# Patient Record
Sex: Male | Born: 2000 | Race: Black or African American | Hispanic: No | Marital: Single | Smoking: Never smoker
Health system: Southern US, Community
[De-identification: ages and names within clinical notes are randomized; demographics above are authoritative.]

## PROBLEM LIST (undated history)

## (undated) DIAGNOSIS — K4090 Unilateral inguinal hernia, without obstruction or gangrene, not specified as recurrent: Secondary | ICD-10-CM

## (undated) DIAGNOSIS — J45909 Unspecified asthma, uncomplicated: Secondary | ICD-10-CM

## (undated) HISTORY — PX: HERNIA REPAIR: SHX51

---
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
## 2021-04-21 ENCOUNTER — Ambulatory Visit (HOSPITAL_COMMUNITY)
Admission: EM | Admit: 2021-04-21 | Discharge: 2021-04-21 | Disposition: A | Discharge status: Home / Self Care | Payer: Medicaid Other | Attending: Physician Assistant | Admitting: Physician Assistant

## 2021-04-21 ENCOUNTER — Encounter (HOSPITAL_COMMUNITY): Payer: Self-pay

## 2021-04-21 ENCOUNTER — Other Ambulatory Visit: Payer: Self-pay

## 2021-04-21 DIAGNOSIS — R519 Headache, unspecified: Secondary | ICD-10-CM | POA: Insufficient documentation

## 2021-04-21 DIAGNOSIS — R0789 Other chest pain: Secondary | ICD-10-CM | POA: Diagnosis not present

## 2021-04-21 DIAGNOSIS — Z79899 Other long term (current) drug therapy: Secondary | ICD-10-CM | POA: Diagnosis not present

## 2021-04-21 DIAGNOSIS — Z20822 Contact with and (suspected) exposure to covid-19: Secondary | ICD-10-CM | POA: Insufficient documentation

## 2021-04-21 DIAGNOSIS — R112 Nausea with vomiting, unspecified: Secondary | ICD-10-CM | POA: Insufficient documentation

## 2021-04-21 DIAGNOSIS — J029 Acute pharyngitis, unspecified: Secondary | ICD-10-CM | POA: Insufficient documentation

## 2021-04-21 DIAGNOSIS — R0602 Shortness of breath: Secondary | ICD-10-CM | POA: Diagnosis not present

## 2021-04-21 DIAGNOSIS — J069 Acute upper respiratory infection, unspecified: Secondary | ICD-10-CM | POA: Diagnosis not present

## 2021-04-21 LAB — POC INFLUENZA A AND B ANTIGEN (URGENT CARE ONLY)
INFLUENZA A ANTIGEN, POC: NEGATIVE
INFLUENZA B ANTIGEN, POC: NEGATIVE

## 2021-04-21 MED ORDER — METHYLPREDNISOLONE SODIUM SUCC 125 MG IJ SOLR
60.0000 mg | Freq: Once | INTRAMUSCULAR | Status: AC
  Administered 2021-04-21: 60 mg via INTRAMUSCULAR

## 2021-04-21 MED ORDER — ALBUTEROL SULFATE HFA 108 (90 BASE) MCG/ACT IN AERS
INHALATION_SPRAY | RESPIRATORY_TRACT | Status: AC
Start: 2021-04-21 — End: ?
  Filled 2021-04-21: qty 6.7

## 2021-04-21 MED ORDER — ALBUTEROL SULFATE HFA 108 (90 BASE) MCG/ACT IN AERS
2.0000 | INHALATION_SPRAY | Freq: Once | RESPIRATORY_TRACT | Status: AC
  Administered 2021-04-21: 2 via RESPIRATORY_TRACT

## 2021-04-21 MED ORDER — PROMETHAZINE-DM 6.25-15 MG/5ML PO SYRP: 5.0000 mL | ORAL_SOLUTION | Freq: Two times a day (BID) | ORAL | 0 refills | Status: DC | PRN

## 2021-04-21 MED ORDER — METHYLPREDNISOLONE SODIUM SUCC 125 MG IJ SOLR
INTRAMUSCULAR | Status: AC
  Filled 2021-04-21: qty 2

## 2021-04-21 NOTE — ED Provider Notes (Signed)
[facility]    CSN: [id number] Arrival date & time: 04/21/21  1839      History   Chief Complaint Chief Complaint  Patient presents with   Cough    HPI Carlos Stevens is a 20 y.o. male.   Patient presents today with a 4-day history of URI symptoms.  Reports sore throat, cough, headache, chest tightness, nausea, vomiting, runny nose.  He denies any fever, chest pain, abdominal pain.  Reports household sick contacts with similar symptoms but they have not been evaluated for illness.  He does have a history of asthma and reports increased shortness of breath since symptoms began.  He does not have an albuterol at home for symptoms.  He has tried multisymptom medication (DayQuil/NyQuil) with minimal improvement of symptoms.  He has not had COVID but has had COVID-19 vaccinations.  He denies any history of diabetes and does well with steroids.  Denies any recent antibiotic use.  He has had difficulty with daily activities as result of symptoms.   Past Medical History:  Diagnosis Date   Asthma    Inguinal hernia     There are no problems to display for this patient.   Past Surgical History:  Procedure Laterality Date   HERNIA REPAIR         Home Medications    Prior to Admission medications   Medication Sig Start Date End Date Taking? Authorizing Provider  promethazine-dextromethorphan (PROMETHAZINE-DM) 6.25-15 MG/5ML syrup Take 5 mLs by mouth 2 (two) times daily as needed for cough. 04/21/21  Yes ,  K, PA-C  albuterol (PROVENTIL HFA;VENTOLIN HFA) 108 (90 Base) MCG/ACT inhaler Inhale 4 puffs into the lungs every 4 (four) hours as needed for wheezing or shortness of breath. 08/22/15   Viviano Simas, NP  albuterol (PROVENTIL) (2.5 MG/3ML) 0.083% nebulizer solution Inhale 3 mLs into the lungs every 4 (four) hours as needed for wheezing or shortness of breath. 08/22/15   Viviano Simas, NP  ibuprofen (ADVIL,MOTRIN) 200 MG tablet Take 400 mg by mouth every 6  (six) hours as needed.    [provider]  penicillin v potassium (VEETID) 500 MG tablet Take 1 tablet (500 mg total) by mouth 4 (four) times daily. 12/29/14   Emilia Beck, PA-C    Family History History reviewed. No pertinent family history.  Social History Social History   Tobacco Use   Smoking status: Never   Smokeless tobacco: Never     Allergies   Patient has no known allergies.   Review of Systems Review of Systems  Constitutional:  Positive for activity change and fatigue. Negative for appetite change and fever.  HENT:  Positive for congestion and sore throat. Negative for sinus pressure and sneezing.   Respiratory:  Positive for cough, chest tightness and shortness of breath.   Cardiovascular:  Negative for chest pain.  Gastrointestinal:  Positive for nausea and vomiting. Negative for abdominal pain and diarrhea.  Musculoskeletal:  Positive for arthralgias and myalgias.  Neurological:  Positive for headaches. Negative for dizziness and light-headedness.    Physical Exam Triage Vital Signs ED Triage Vitals  Enc Vitals Group     BP 04/21/21 1950 (!) 130/104     Pulse Rate 04/21/21 1950 66     Resp 04/21/21 1950 19     Temp 04/21/21 1950 98.6 F (37 C)     Temp Source 04/21/21 1950 Oral     SpO2 04/21/21 1950 98 %     Weight --  Height --      Head Circumference --      Peak Flow --      Pain Score 04/21/21 1949 1     Pain Loc --      Pain Edu? --      Excl. in [place]? --    No data found.  Updated Vital Signs BP (!) 130/104 (BP Location: Right Arm)   Pulse 66   Temp 98.6 F (37 C) (Oral)   Resp 19   SpO2 98%   Visual Acuity Right Eye Distance:   Left Eye Distance:   Bilateral Distance:    Right Eye Near:   Left Eye Near:    Bilateral Near:     Physical Exam Vitals reviewed.  Constitutional:      General: He is awake.     Appearance: Normal appearance. He is normal weight. He is not ill-appearing.  HENT:     Head:  Normocephalic and atraumatic.     Right Ear: Tympanic membrane, ear canal and external ear normal. Tympanic membrane is not erythematous or bulging.     Left Ear: Tympanic membrane, ear canal and external ear normal. Tympanic membrane is not erythematous or bulging.     Nose: Nose normal.     Mouth/Throat:     Pharynx: Uvula midline. Posterior oropharyngeal erythema present. No oropharyngeal exudate.  Cardiovascular:     Rate and Rhythm: Normal rate and regular rhythm.     Heart sounds: Normal heart sounds, S1 normal and S2 normal. No murmur heard. Pulmonary:     Effort: Pulmonary effort is normal. No accessory muscle usage or respiratory distress.     Breath sounds: Normal breath sounds. No stridor. No wheezing, rhonchi or rales.     Comments: Decreased aeration bilateral bases that improved with in office albuterol Abdominal:     General: Bowel sounds are normal.     Palpations: Abdomen is soft.     Tenderness: There is no abdominal tenderness.  Lymphadenopathy:     Head:     Right side of head: No submental, submandibular or tonsillar adenopathy.     Left side of head: No submental, submandibular or tonsillar adenopathy.     Cervical: No cervical adenopathy.  Neurological:     Mental Status: He is alert.  Psychiatric:        Behavior: Behavior is cooperative.     UC Treatments / Results  Labs (all labs ordered are listed, but only abnormal results are displayed) Labs Reviewed  SARS CORONAVIRUS 2 (TAT 6-24 HRS)  POC INFLUENZA A AND B ANTIGEN (URGENT CARE ONLY)    EKG   Radiology No results found.  Procedures Procedures (including critical care time)  Medications Ordered in UC Medications  albuterol (VENTOLIN HFA) 108 (90 Base) MCG/ACT inhaler 2 puff (2 puffs Inhalation Given 04/21/21 2032)  methylPREDNISolone sodium succinate (SOLU-MEDROL) 125 mg/2 mL injection 60 mg (60 mg Intramuscular Given 04/21/21 2032)    Initial Impression / Assessment and Plan / UC Course   I have reviewed the triage vital signs and the nursing notes.  Pertinent labs & imaging results that were available during my care of the patient were reviewed by me and considered in my medical decision making (see chart for details).      Discussed likely viral etiology given short duration of symptoms.  Flu testing was negative.  COVID test is pending.  Patient was instructed to remain in isolation until COVID results are obtained.  He was  given work excuse note with current CDC return to work guidelines based on test results.  Patient was given albuterol and 60 mg of Solu-Medrol in clinic with improvement but not resolution of symptoms.  Recommended he continue using albuterol every 4-6 hours as needed for shortness of breath and cough.  No evidence of acute infection that warrant initiation of antibiotics.  He was prescribed promethazine DM for nocturnal cough symptoms with instruction not to drive or drink alcohol while taking this medication as drowsiness is a common side effect.  Recommended to use over-the-counter medications including Mucinex, Flonase, Tylenol for symptom relief.  He is to rest and drink plenty of fluid.  Discussed alarm symptoms that warrant emergent evaluation.  Strict return precautions given to which she expressed understanding.  Final Clinical Impressions(s) / UC Diagnoses   Final diagnoses:  Viral URI with cough  Shortness of breath     Discharge Instructions      Your flu test is negative.  We will contact you if your COVID test is positive.  Please remain in isolation until you get your COVID result.  Use Tylenol/Mucinex/Flonase for symptom relief.  Continue using albuterol every 4-6 hours as needed for shortness of breath.  If you have any worsening symptoms you need to go to the emergency room.  Follow-up with your primary care provider within a week for reevaluation.     ED Prescriptions     Medication Sig Dispense Auth. Provider    promethazine-dextromethorphan (PROMETHAZINE-DM) 6.25-15 MG/5ML syrup Take 5 mLs by mouth 2 (two) times daily as needed for cough. 118 mL ,  K, PA-C      PDMP not reviewed this encounter.   Jeani Hawking, PA-C 04/21/21 2049

## 2021-04-21 NOTE — ED Triage Notes (Signed)
Pt presents with a cough, headache and vomiting. Pt states he has a sore throat and runny nose.

## 2021-04-21 NOTE — Discharge Instructions (Addendum)
Your flu test is negative.  We will contact you if your COVID test is positive.  Please remain in isolation until you get your COVID result.  Use Tylenol/Mucinex/Flonase for symptom relief.  Continue using albuterol every 4-6 hours as needed for shortness of breath.  If you have any worsening symptoms you need to go to the emergency room.  Follow-up with your primary care provider within a week for reevaluation.

## 2021-04-22 LAB — SARS CORONAVIRUS 2 (TAT 6-24 HRS): SARS Coronavirus 2: NEGATIVE

## 2021-05-06 ENCOUNTER — Encounter (HOSPITAL_COMMUNITY): Payer: Self-pay

## 2021-05-06 ENCOUNTER — Ambulatory Visit (HOSPITAL_COMMUNITY)
Admission: EM | Admit: 2021-05-06 | Discharge: 2021-05-06 | Disposition: A | Discharge status: Home / Self Care | Payer: Medicaid Other | Attending: Student | Admitting: Student

## 2021-05-06 ENCOUNTER — Other Ambulatory Visit: Payer: Self-pay

## 2021-05-06 DIAGNOSIS — J4521 Mild intermittent asthma with (acute) exacerbation: Secondary | ICD-10-CM | POA: Diagnosis not present

## 2021-05-06 MED ORDER — PREDNISONE 20 MG PO TABS: 40.0000 mg | ORAL_TABLET | Freq: Every day | ORAL | 0 refills | Status: AC

## 2021-05-06 NOTE — ED Triage Notes (Signed)
Pt c/o asthma exacerbation with SOB x1wk. No distress noted, speaking in complete sentences. Denies cough or congestion.

## 2021-05-06 NOTE — Discharge Instructions (Addendum)
-  Albuterol inhaler or nebulizer as needed for cough, wheezing, shortness of breath, 1 to 2 puffs every 6 hours as needed. Can increase to 6 puffs if necessary -Prednisone, 2 pills taken at the same time for 5 days in a row.  Try taking this earlier in the day as it can give you energy. Avoid NSAIDs like ibuprofen and alleve while taking this medication as they can increase your risk of stomach upset and even GI bleeding when in combination with a steroid. You can continue tylenol (acetaminophen) up to 1000mg  3x daily.

## 2021-05-06 NOTE — ED Provider Notes (Signed)
[facility]    CSN: [id number] Arrival date & time: 05/06/21  0940      History   Chief Complaint Chief Complaint  Patient presents with   Asthma    HPI Carlos Stevens is a 20 y.o. male presenting with asthma and shortness of breath for 1 week, following flulike illness.  States that his symptoms of viral syndrome resolved but he is still having some wheezing and shortness of breath, worse with ambulating or talking.  Is using his albuterol inhaler every 4 hours which does help but only temporarily.  Denies fever/chills.  Cough is nonproductive.  HPI  Past Medical History:  Diagnosis Date   Asthma    Inguinal hernia     There are no problems to display for this patient.   Past Surgical History:  Procedure Laterality Date   HERNIA REPAIR         Home Medications    Prior to Admission medications   Medication Sig Start Date End Date Taking? Authorizing Provider  predniSONE (DELTASONE) 20 MG tablet Take 2 tablets (40 mg total) by mouth daily for 5 days. Take with breakfast or lunch. Avoid NSAIDs (ibuprofen, etc) while taking this medication. 05/06/21 05/11/21 Yes Hazel Sams, PA-C  albuterol (PROVENTIL HFA;VENTOLIN HFA) 108 (90 Base) MCG/ACT inhaler Inhale 4 puffs into the lungs every 4 (four) hours as needed for wheezing or shortness of breath. 08/22/15   Charmayne Sheer, NP  albuterol (PROVENTIL) (2.5 MG/3ML) 0.083% nebulizer solution Inhale 3 mLs into the lungs every 4 (four) hours as needed for wheezing or shortness of breath. 08/22/15   Charmayne Sheer, NP  ibuprofen (ADVIL,MOTRIN) 200 MG tablet Take 400 mg by mouth every 6 (six) hours as needed.    [provider]    Family History History reviewed. No pertinent family history.  Social History Social History   Tobacco Use   Smoking status: Never   Smokeless tobacco: Never     Allergies   Patient has no known allergies.   Review of Systems Review of Systems  Constitutional:   Negative for appetite change, chills and fever.  HENT:  Positive for congestion. Negative for ear pain, rhinorrhea, sinus pressure, sinus pain and sore throat.   Eyes:  Negative for redness and visual disturbance.  Respiratory:  Positive for cough. Negative for chest tightness, shortness of breath and wheezing.   Cardiovascular:  Negative for chest pain and palpitations.  Gastrointestinal:  Negative for abdominal pain, constipation, diarrhea, nausea and vomiting.  Genitourinary:  Negative for dysuria, frequency and urgency.  Musculoskeletal:  Negative for myalgias.  Neurological:  Negative for dizziness, weakness and headaches.  Psychiatric/Behavioral:  Negative for confusion.   All other systems reviewed and are negative.   Physical Exam Triage Vital Signs ED Triage Vitals  Enc Vitals Group     BP 05/06/21 1056 (!) 139/94     Pulse Rate 05/06/21 1056 88     Resp 05/06/21 1056 16     Temp 05/06/21 1056 99.1 F (37.3 C)     Temp Source 05/06/21 1056 Oral     SpO2 05/06/21 1056 100 %     Weight --      Height --      Head Circumference --      Peak Flow --      Pain Score 05/06/21 1057 0     Pain Loc --      Pain Edu? --      Excl. in [place]? --  No data found.  Updated Vital Signs BP (!) 139/94 (BP Location: Left Arm)   Pulse 88   Temp 99.1 F (37.3 C) (Oral)   Resp 16   SpO2 100%   Visual Acuity Right Eye Distance:   Left Eye Distance:   Bilateral Distance:    Right Eye Near:   Left Eye Near:    Bilateral Near:     Physical Exam Vitals reviewed.  Constitutional:      General: He is not in acute distress.    Appearance: Normal appearance. He is not ill-appearing.  HENT:     Head: Normocephalic and atraumatic.     Right Ear: Tympanic membrane, ear canal and external ear normal. No tenderness. No middle ear effusion. There is no impacted cerumen. Tympanic membrane is not perforated, erythematous, retracted or bulging.     Left Ear: Tympanic membrane, ear canal  and external ear normal. No tenderness.  No middle ear effusion. There is no impacted cerumen. Tympanic membrane is not perforated, erythematous, retracted or bulging.     Nose: Nose normal. No congestion.     Mouth/Throat:     Mouth: Mucous membranes are moist.     Pharynx: Uvula midline. No oropharyngeal exudate or posterior oropharyngeal erythema.  Eyes:     Extraocular Movements: Extraocular movements intact.     Pupils: Pupils are equal, round, and reactive to light.  Cardiovascular:     Rate and Rhythm: Normal rate and regular rhythm.     Heart sounds: Normal heart sounds.  Pulmonary:     Effort: Pulmonary effort is normal.     Breath sounds: Normal breath sounds. No decreased breath sounds, wheezing, rhonchi or rales.  Abdominal:     Palpations: Abdomen is soft.     Tenderness: There is no abdominal tenderness. There is no guarding or rebound.  Lymphadenopathy:     Cervical: No cervical adenopathy.     Right cervical: No superficial cervical adenopathy.    Left cervical: No superficial cervical adenopathy.  Neurological:     General: No focal deficit present.     Mental Status: He is alert and oriented to person, place, and time.  Psychiatric:        Mood and Affect: Mood normal.        Behavior: Behavior normal.        Thought Content: Thought content normal.        Judgment: Judgment normal.     UC Treatments / Results  Labs (all labs ordered are listed, but only abnormal results are displayed) Labs Reviewed - No data to display  EKG   Radiology No results found.  Procedures Procedures (including critical care time)  Medications Ordered in UC Medications - No data to display  Initial Impression / Assessment and Plan / UC Course  I have reviewed the triage vital signs and the nursing notes.  Pertinent labs & imaging results that were available during my care of the patient were reviewed by me and considered in my medical decision making (see chart for  details).     This patient is a very pleasant 20 y.o. year old male presenting with asthma exacerbation related to viral URI. Today this pt is afebrile nontachycardic nontachypneic, oxygenating well on room air, no wheezes rhonchi or rales.   Continue albuterol. Also sent prednisone.   ED return precautions discussed. Patient verbalizes understanding and agreement.   Level 4 for acute exacerbation of chronic condition and prescription drug management.   Final  Clinical Impressions(s) / UC Diagnoses   Final diagnoses:  Mild intermittent asthma with acute exacerbation     Discharge Instructions      -Albuterol inhaler or nebulizer as needed for cough, wheezing, shortness of breath, 1 to 2 puffs every 6 hours as needed. Can increase to 6 puffs if necessary -Prednisone, 2 pills taken at the same time for 5 days in a row.  Try taking this earlier in the day as it can give you energy. Avoid NSAIDs like ibuprofen and alleve while taking this medication as they can increase your risk of stomach upset and even GI bleeding when in combination with a steroid. You can continue tylenol (acetaminophen) up to 1000mg  3x daily.     ED Prescriptions     Medication Sig Dispense Auth. Provider   predniSONE (DELTASONE) 20 MG tablet Take 2 tablets (40 mg total) by mouth daily for 5 days. Take with breakfast or lunch. Avoid NSAIDs (ibuprofen, etc) while taking this medication. 10 tablet Hazel Sams, PA-C      PDMP not reviewed this encounter.   Hazel Sams, PA-C 05/06/21 1209

## 2021-09-02 ENCOUNTER — Emergency Department (HOSPITAL_COMMUNITY)
Admission: EM | Admit: 2021-09-02 | Discharge: 2021-09-03 | Disposition: A | Discharge status: Home / Self Care | Payer: Medicaid Other | Attending: Emergency Medicine | Admitting: Emergency Medicine

## 2021-09-02 ENCOUNTER — Encounter (HOSPITAL_COMMUNITY): Payer: Self-pay

## 2021-09-02 ENCOUNTER — Other Ambulatory Visit: Payer: Self-pay

## 2021-09-02 DIAGNOSIS — Z20822 Contact with and (suspected) exposure to covid-19: Secondary | ICD-10-CM | POA: Diagnosis not present

## 2021-09-02 DIAGNOSIS — J45909 Unspecified asthma, uncomplicated: Secondary | ICD-10-CM | POA: Diagnosis not present

## 2021-09-02 DIAGNOSIS — J029 Acute pharyngitis, unspecified: Secondary | ICD-10-CM | POA: Insufficient documentation

## 2021-09-02 MED ORDER — LIDOCAINE VISCOUS HCL 2 % MT SOLN
15.0000 mL | Freq: Once | OROMUCOSAL | Status: AC
  Administered 2021-09-02: 15 mL via OROMUCOSAL
  Filled 2021-09-02: qty 15

## 2021-09-02 MED ORDER — IBUPROFEN 400 MG PO TABS
600.0000 mg | ORAL_TABLET | Freq: Once | ORAL | Status: AC
  Administered 2021-09-02: 600 mg via ORAL
  Filled 2021-09-02: qty 1

## 2021-09-02 MED ORDER — DEXAMETHASONE SODIUM PHOSPHATE 10 MG/ML IJ SOLN
10.0000 mg | Freq: Once | INTRAMUSCULAR | Status: AC
Start: 2021-09-02 — End: 2021-09-02
  Administered 2021-09-02: 10 mg via INTRAMUSCULAR
  Filled 2021-09-02: qty 1

## 2021-09-02 NOTE — ED Triage Notes (Signed)
Pt presents to the ED from home with complaints of sore throat, fever, cough with production onset a few days ago, worse tonight. Pt states he has been coughing up yellow sputum.

## 2021-09-02 NOTE — ED Provider Triage Note (Signed)
Emergency Medicine Provider Triage Evaluation Note  Carlos Stevens , a 21 y.o. male  was evaluated in triage.  Pt complains of sore throat, fever and cough.  Symptoms been present for the past 2 to 3 days.  Patient reports sore throat has become quite severe and he has been having a lot of pain with swallowing so has been spitting his secretions into a cup.  Does report a cough that is intermittently productive of yellow mucus.  No chest pain or shortness of breath.  No known sick contacts.  Review of Systems  Positive: Fever, sore throat, cough Negative: Vomiting, abdominal pain, chest pain, shortness of breath  Physical Exam  BP (!) 146/92    Pulse (!) 114    Temp (!) 102.4 F (39.1 C) (Oral)    Resp 16    SpO2 96%  Gen:   Awake, no distress   Resp:  Normal effort  MSK:   Moves extremities without difficulty  Other:  Bilateral tonsillar edema with exudates, uvula midline   Medical Decision Making  Medically screening exam initiated at 11:14 PM.  Appropriate orders placed.  Carlos Stevens was informed that the remainder of the evaluation will be completed by another provider, this initial triage assessment does not replace that evaluation, and the importance of remaining in the ED until their evaluation is complete.  High suspicion for strep, but with underlying cough as well we will check for COVID and flu.  Medications ordered for symptomatic management.   Dartha Lodge, [place] 09/02/21 2336

## 2021-09-03 ENCOUNTER — Emergency Department (HOSPITAL_COMMUNITY): Payer: Medicaid Other

## 2021-09-03 LAB — RESP PANEL BY RT-PCR (FLU A&B, COVID) ARPGX2
Influenza A by PCR: NEGATIVE
Influenza B by PCR: NEGATIVE
SARS Coronavirus 2 by RT PCR: NEGATIVE

## 2021-09-03 LAB — GROUP A STREP BY PCR: Group A Strep by PCR: NOT DETECTED

## 2021-09-03 MED ORDER — AMOXICILLIN 500 MG PO CAPS: 500.0000 mg | ORAL_CAPSULE | Freq: Three times a day (TID) | ORAL | 0 refills | Status: AC

## 2021-09-03 MED ORDER — IBUPROFEN 600 MG PO TABS: 600.0000 mg | ORAL_TABLET | Freq: Four times a day (QID) | ORAL | 0 refills | Status: AC | PRN

## 2021-09-03 MED ORDER — KETOROLAC TROMETHAMINE 30 MG/ML IJ SOLN
30.0000 mg | Freq: Once | INTRAMUSCULAR | Status: AC
  Administered 2021-09-03: 30 mg via INTRAMUSCULAR
  Filled 2021-09-03: qty 1

## 2021-09-03 MED ORDER — HYDROCODONE-ACETAMINOPHEN 5-325 MG PO TABS: 1.0000 | ORAL_TABLET | ORAL | 0 refills | Status: AC | PRN

## 2021-09-03 MED ORDER — ACETAMINOPHEN 500 MG PO TABS
1000.0000 mg | ORAL_TABLET | Freq: Once | ORAL | Status: AC
  Administered 2021-09-03: 1000 mg via ORAL
  Filled 2021-09-03: qty 2

## 2021-09-03 MED ORDER — LIDOCAINE VISCOUS HCL 2 % MT SOLN
15.0000 mL | Freq: Once | OROMUCOSAL | Status: AC
  Administered 2021-09-03: 15 mL via OROMUCOSAL
  Filled 2021-09-03: qty 15

## 2021-09-03 NOTE — ED Provider Notes (Signed)
?[facility] EMERGENCY DEPARTMENT ?Provider Note ? ? ?CSN: [id number] ?Arrival date & time: 09/02/21  2257 ? ?  ? ?History ? ?Chief Complaint  ?Patient presents with  ? Sore Throat  ? Cough  ? Fever  ? ? ?Roverto Stevens is a 21 y.o. male. ? ?Pt is a 21 yo aa male with a past medical hx significant for asthma.  He presents to the ED with sore throat, fever, and cough for 2-3 days.  Pt said he is able to swallow, but pain is severe.  He has been waiting for several hours and was given decadron, ibuprofen, and lidocaine while waiting.  This has helped, but pain is still there. ? ? ?  ? ?Home Medications ?Prior to Admission medications   ?Medication Sig Start Date End Date Taking? Authorizing Provider  ?amoxicillin (AMOXIL) 500 MG capsule Take 1 capsule (500 mg total) by mouth 3 (three) times daily. 09/03/21  Yes Jacalyn Lefevre, MD  ?HYDROcodone-acetaminophen (NORCO/VICODIN) 5-325 MG tablet Take 1 tablet by mouth every 4 (four) hours as needed. 09/03/21  Yes Jacalyn Lefevre, MD  ?ibuprofen (ADVIL) 600 MG tablet Take 1 tablet (600 mg total) by mouth every 6 (six) hours as needed. 09/03/21  Yes Jacalyn Lefevre, MD  ?albuterol (PROVENTIL HFA;VENTOLIN HFA) 108 (90 Base) MCG/ACT inhaler Inhale 4 puffs into the lungs every 4 (four) hours as needed for wheezing or shortness of breath. 08/22/15   Viviano Simas, NP  ?albuterol (PROVENTIL) (2.5 MG/3ML) 0.083% nebulizer solution Inhale 3 mLs into the lungs every 4 (four) hours as needed for wheezing or shortness of breath. 08/22/15   Viviano Simas, NP  ?   ? ?Allergies    ?Patient has no known allergies.   ? ?Review of Systems   ?Review of Systems  ?HENT:  Positive for sore throat.   ?Respiratory:  Positive for cough.   ?All other systems reviewed and are negative. ? ?Physical Exam ?Updated Vital Signs ?BP (!) 127/93 (BP Location: Left Arm)   Pulse 63   Temp 97.9 ?F (36.6 ?C) (Oral)   Resp 16   Ht 5\' 8"  (1.727 m)   Wt 63 kg   SpO2 98%   BMI 21.13 kg/m?  ?Physical  Exam ?Vitals and nursing note reviewed.  ?Constitutional:   ?   Appearance: He is well-developed.  ?HENT:  ?   Head: Normocephalic and atraumatic.  ?   Nose: Congestion present.  ?   Mouth/Throat:  ?   Mouth: Mucous membranes are dry.  ?   Pharynx: Pharyngeal swelling, oropharyngeal exudate and posterior oropharyngeal erythema present.  ?Eyes:  ?   Conjunctiva/sclera: Conjunctivae normal.  ?   Pupils: Pupils are equal, round, and reactive to light.  ?Cardiovascular:  ?   Rate and Rhythm: Normal rate and regular rhythm.  ?   Heart sounds: Normal heart sounds.  ?Pulmonary:  ?   Effort: Pulmonary effort is normal.  ?   Breath sounds: Normal breath sounds.  ?Abdominal:  ?   General: Bowel sounds are normal.  ?   Palpations: Abdomen is soft.  ?Musculoskeletal:  ?   Cervical back: Normal range of motion and neck supple.  ?Lymphadenopathy:  ?   Cervical: Cervical adenopathy present.  ?   Right cervical: Superficial cervical adenopathy present.  ?   Left cervical: Superficial cervical adenopathy present.  ?Skin: ?   General: Skin is warm.  ?   Capillary Refill: Capillary refill takes less than 2 seconds.  ?Neurological:  ?  General: No focal deficit present.  ?   Mental Status: He is alert.  ?Psychiatric:     ?   Mood and Affect: Mood normal.     ?   Behavior: Behavior normal.  ? ? ?ED Results / Procedures / Treatments   ?Labs ?(all labs ordered are listed, but only abnormal results are displayed) ?Labs Reviewed  ?GROUP A STREP BY PCR  ?RESP PANEL BY RT-PCR (FLU A&B, COVID) [id number]  ? ? ?EKG ?None ? ?Radiology ?DG Chest 2 View ? ?Result Date: 09/03/2021 ?CLINICAL DATA:  Sore throat and fever for 4 days. EXAM: CHEST - 2 VIEW COMPARISON:  August 22, 2015 FINDINGS: The heart size and mediastinal contours are within normal limits. Both lungs are clear. The visualized skeletal structures are unremarkable. IMPRESSION: No active cardiopulmonary disease. Electronically Signed   By: Ted Mcalpine M.D.   On: 09/03/2021 08:17    ? ?Procedures ?Procedures  ? ? ?Medications Ordered in ED ?Medications  ?lidocaine (XYLOCAINE) 2 % viscous mouth solution 15 mL (has no administration in time range)  ?ketorolac (TORADOL) 30 MG/ML injection 30 mg (has no administration in time range)  ?acetaminophen (TYLENOL) tablet 1,000 mg (has no administration in time range)  ?dexamethasone (DECADRON) injection 10 mg (10 mg Intramuscular Given 09/02/21 2336)  ?ibuprofen (ADVIL) tablet 600 mg (600 mg Oral Given 09/02/21 2337)  ?lidocaine (XYLOCAINE) 2 % viscous mouth solution 15 mL (15 mLs Mouth/Throat Given 09/02/21 2337)  ? ? ?ED Course/ Medical Decision Making/ A&P ?  ?                        ?Medical Decision Making ?Amount and/or Complexity of Data Reviewed ?Radiology: ordered. ? ?Risk ?OTC drugs. ?Prescription drug management. ? ? ?This patient presents to the ED for concern of sore throat, this involves an extensive number of treatment options, and is a complaint that carries with it a high risk of complications and morbidity.  The differential diagnosis includes strep, viral pharyngitis, covid, flu ? ? ?Co morbidities that complicate the patient evaluation ? ?asthma ? ? ?Additional history obtained: ? ?Additional history obtained from epic chart review ? ? ? ?Lab Tests: ? ?I Ordered, and personally interpreted labs.  The pertinent results include:  covid/flu/strep neg ? ? ?Imaging Studies ordered: ? ?I ordered imaging studies including cxr  ?I independently visualized and interpreted imaging which showed nothing acute ?I agree with the radiologist interpretation ? ? ?Medicines ordered and prescription drug management: ? ?I ordered medication including decadron/toradol/lidocaine  for sore throat  ?Reevaluation of the patient after these medicines showed that the patient improved ?I have reviewed the patients home medicines and have made adjustments as needed ? ? ? ?Problem List / ED Course: ? ?Sore throat:  strep and covid neg, but throat is very swollen and  red.  Will start abx and steroids. ? ? ?Reevaluation: ? ?After the interventions noted above, I reevaluated the patient and found that they have :improved ? ? ?Social Determinants of Health: ? ?Lives at home ? ? ?Dispostion: ? ?After consideration of the diagnostic results and the patients response to treatment, I feel that the patent would benefit from discharge with outpatient f/u.   ? ? ? ? ? ? ? ?Final Clinical Impression(s) / ED Diagnoses ?Final diagnoses:  ?Pharyngitis, unspecified etiology  ? ? ?Rx / DC Orders ?ED Discharge Orders   ? ?      Ordered  ?  amoxicillin (AMOXIL) 500 MG  capsule  3 times daily       ? 09/03/21 0814  ?  ibuprofen (ADVIL) 600 MG tablet  Every 6 hours PRN       ? 09/03/21 0814  ?  HYDROcodone-acetaminophen (NORCO/VICODIN) 5-325 MG tablet  Every 4 hours PRN       ? 09/03/21 0814  ? ?  ?  ? ?  ? ? ?  ?Jacalyn Lefevre, MD ?09/03/21 [phone] ? ?

## 2023-02-24 ENCOUNTER — Encounter (HOSPITAL_COMMUNITY): Payer: Self-pay

## 2023-02-24 ENCOUNTER — Emergency Department (HOSPITAL_COMMUNITY)
Admission: EM | Admit: 2023-02-24 | Discharge: 2023-02-24 | Disposition: A | Discharge status: Home / Self Care | Payer: Medicaid Other | Attending: Emergency Medicine | Admitting: Emergency Medicine

## 2023-02-24 ENCOUNTER — Emergency Department (HOSPITAL_COMMUNITY): Payer: Medicaid Other

## 2023-02-24 ENCOUNTER — Other Ambulatory Visit: Payer: Self-pay

## 2023-02-24 DIAGNOSIS — R0602 Shortness of breath: Secondary | ICD-10-CM | POA: Diagnosis present

## 2023-02-24 DIAGNOSIS — R0789 Other chest pain: Secondary | ICD-10-CM | POA: Insufficient documentation

## 2023-02-24 DIAGNOSIS — J45909 Unspecified asthma, uncomplicated: Secondary | ICD-10-CM | POA: Insufficient documentation

## 2023-02-24 DIAGNOSIS — Z20822 Contact with and (suspected) exposure to covid-19: Secondary | ICD-10-CM | POA: Diagnosis not present

## 2023-02-24 LAB — SARS CORONAVIRUS 2 BY RT PCR: SARS Coronavirus 2 by RT PCR: NEGATIVE

## 2023-02-24 MED ORDER — ALBUTEROL SULFATE HFA 108 (90 BASE) MCG/ACT IN AERS
2.0000 | INHALATION_SPRAY | Freq: Four times a day (QID) | RESPIRATORY_TRACT | Status: DC
  Filled 2023-02-24: qty 6.7

## 2023-02-24 MED ORDER — PREDNISONE 20 MG PO TABS
60.0000 mg | ORAL_TABLET | ORAL | Status: AC
  Administered 2023-02-24: 60 mg via ORAL
  Filled 2023-02-24: qty 3

## 2023-02-24 MED ORDER — PREDNISONE 20 MG PO TABS: 40.0000 mg | ORAL_TABLET | Freq: Every day | ORAL | 0 refills | Status: AC

## 2023-02-24 MED ORDER — ALBUTEROL SULFATE (2.5 MG/3ML) 0.083% IN NEBU
5.0000 mg | INHALATION_SOLUTION | Freq: Once | RESPIRATORY_TRACT | Status: AC
  Administered 2023-02-24: 5 mg via RESPIRATORY_TRACT
  Filled 2023-02-24: qty 6

## 2023-02-24 NOTE — ED Provider Notes (Signed)
[place] EMERGENCY DEPARTMENT AT [facility] Provider Note   CSN: [id number] Arrival date & time: 02/24/23  1133     History  Chief Complaint  Patient presents with   Shortness of Breath         Carlos Stevens is a 22 y.o. male.  HPI Patient presents with his girlfriend who assist with the history.  He presents with concern for 1 day of chest tightness, heaviness and sore throat as well as cold-like symptoms.  Patient smoke cigarettes, has a history of asthma.  In spite of onset of symptoms similar to asthma exacerbation patient did not take any medication today before ED evaluation.  No fever, vomiting    Home Medications Prior to Admission medications   Medication Sig Start Date End Date Taking? Authorizing Provider  predniSONE (DELTASONE) 20 MG tablet Take 2 tablets (40 mg total) by mouth daily with breakfast. For the next four days 02/24/23  Yes Gerhard Munch, MD  albuterol (PROVENTIL HFA;VENTOLIN HFA) 108 (90 Base) MCG/ACT inhaler Inhale 4 puffs into the lungs every 4 (four) hours as needed for wheezing or shortness of breath. 08/22/15   Viviano Simas, NP  albuterol (PROVENTIL) (2.5 MG/3ML) 0.083% nebulizer solution Inhale 3 mLs into the lungs every 4 (four) hours as needed for wheezing or shortness of breath. 08/22/15   Viviano Simas, NP  amoxicillin (AMOXIL) 500 MG capsule Take 1 capsule (500 mg total) by mouth 3 (three) times daily. 09/03/21   Jacalyn Lefevre, MD  HYDROcodone-acetaminophen (NORCO/VICODIN) 5-325 MG tablet Take 1 tablet by mouth every 4 (four) hours as needed. 09/03/21   Jacalyn Lefevre, MD  ibuprofen (ADVIL) 600 MG tablet Take 1 tablet (600 mg total) by mouth every 6 (six) hours as needed. 09/03/21   Jacalyn Lefevre, MD      Allergies    Patient has no known allergies.    Review of Systems   Review of Systems  All other systems reviewed and are negative.   Physical Exam Updated Vital Signs BP (!) 142/95   Pulse 62   Temp 98.3 F (36.8 C)  (Oral)   Resp 17   Ht 5\' 8"  (1.727 m)   Wt 63.5 kg   SpO2 100%   BMI 21.29 kg/m  Physical Exam Vitals and nursing note reviewed.  Constitutional:      General: He is not in acute distress.    Appearance: He is well-developed.  HENT:     Head: Normocephalic and atraumatic.  Eyes:     Conjunctiva/sclera: Conjunctivae normal.  Cardiovascular:     Rate and Rhythm: Normal rate and regular rhythm.  Pulmonary:     Effort: Pulmonary effort is normal. No respiratory distress.     Breath sounds: No stridor. Decreased breath sounds present.  Abdominal:     General: There is no distension.  Skin:    General: Skin is warm and dry.  Neurological:     Mental Status: He is alert and oriented to person, place, and time.     ED Results / Procedures / Treatments   Labs (all labs ordered are listed, but only abnormal results are displayed) Labs Reviewed  SARS CORONAVIRUS 2 BY RT PCR    EKG None  Radiology DG Chest 2 View  Result Date: 02/24/2023 CLINICAL DATA:  smoker, asthma, SOB EXAM: CHEST - 2 VIEW COMPARISON:  09/03/2021. FINDINGS: Bilateral lung fields are clear. Bilateral costophrenic angles are clear. Normal cardio-mediastinal silhouette. No acute osseous abnormalities. The soft tissues are within  normal limits. IMPRESSION: No active cardiopulmonary disease. Electronically Signed   By: Jules Schick M.D.   On: 02/24/2023 13:44    Procedures Procedures    Medications Ordered in ED Medications  albuterol (VENTOLIN HFA) 108 (90 Base) MCG/ACT inhaler 2 puff (has no administration in time range)  albuterol (PROVENTIL) (2.5 MG/3ML) 0.083% nebulizer solution 5 mg (5 mg Nebulization Given 02/24/23 1204)  predniSONE (DELTASONE) tablet 60 mg (60 mg Oral Given 02/24/23 1204)    ED Course/ Medical Decision Making/ A&P                                 Medical Decision Making Well adult male with history of asthma presents with chest heaviness, increased work of breathing.  Concern  for viral infection versus pneumonia given his smoking history versus asthma exacerbation.  Absence of obvious wheezing is somewhat reassuring, patient started on bronchodilators, steroids, x-ray ordered pulse ox 100% room air normal   Amount and/or Complexity of Data Reviewed Independent Historian: friend Labs: ordered. Decision-making details documented in ED Course. Radiology: ordered and independent interpretation performed. Decision-making details documented in ED Course.  Risk Prescription drug management.   2:17 PM Patient awake, alert, state that he feels better.  COVID-negative, pneumonia not demonstrated on x-ray, patient discharged in stable condition.        Final Clinical Impression(s) / ED Diagnoses Final diagnoses:  Shortness of breath    Rx / DC Orders ED Discharge Orders          Ordered    predniSONE (DELTASONE) 20 MG tablet  Daily with breakfast        02/24/23 1417              Gerhard Munch, MD 02/24/23 1418

## 2023-02-24 NOTE — ED Triage Notes (Addendum)
Pt coming in complaining of feeling short or breath, scratchy throat, or general cold s/s that began today. Pt able to speak in full sentences, hx of asthma.

## 2023-02-24 NOTE — Discharge Instructions (Addendum)
Please use the provided albuterol every 4 hours for the next 2 days and then as needed.  Obtain and take your steroids as prescribed.  Return here for concerning changes in your condition.

## 2023-03-01 ENCOUNTER — Other Ambulatory Visit: Payer: Self-pay

## 2023-03-01 ENCOUNTER — Encounter (HOSPITAL_COMMUNITY): Payer: Self-pay | Admitting: Emergency Medicine

## 2023-03-01 ENCOUNTER — Emergency Department (HOSPITAL_COMMUNITY): Payer: Medicaid Other

## 2023-03-01 ENCOUNTER — Emergency Department (HOSPITAL_COMMUNITY)
Admission: EM | Admit: 2023-03-01 | Discharge: 2023-03-01 | Disposition: A | Discharge status: Home / Self Care | Payer: Medicaid Other | Attending: Emergency Medicine | Admitting: Emergency Medicine

## 2023-03-01 DIAGNOSIS — Z1152 Encounter for screening for COVID-19: Secondary | ICD-10-CM | POA: Diagnosis not present

## 2023-03-01 DIAGNOSIS — J4531 Mild persistent asthma with (acute) exacerbation: Secondary | ICD-10-CM | POA: Diagnosis not present

## 2023-03-01 DIAGNOSIS — R0602 Shortness of breath: Secondary | ICD-10-CM

## 2023-03-01 LAB — SARS CORONAVIRUS 2 BY RT PCR: SARS Coronavirus 2 by RT PCR: NEGATIVE

## 2023-03-01 MED ORDER — ALBUTEROL SULFATE (2.5 MG/3ML) 0.083% IN NEBU
2.5000 mg | INHALATION_SOLUTION | Freq: Once | RESPIRATORY_TRACT | Status: AC
  Administered 2023-03-01: 2.5 mg via RESPIRATORY_TRACT
  Filled 2023-03-01: qty 3

## 2023-03-01 MED ORDER — ALBUTEROL SULFATE (2.5 MG/3ML) 0.083% IN NEBU: 2.5000 mg | INHALATION_SOLUTION | Freq: Four times a day (QID) | RESPIRATORY_TRACT | 12 refills | Status: DC | PRN

## 2023-03-01 MED ORDER — ALBUTEROL SULFATE (2.5 MG/3ML) 0.083% IN NEBU: 2.5000 mg | INHALATION_SOLUTION | Freq: Four times a day (QID) | RESPIRATORY_TRACT | 12 refills | Status: AC | PRN

## 2023-03-01 NOTE — ED Triage Notes (Signed)
PT reports SHOB x 1 week. Pt reports he was seen last week with no improvement.

## 2023-03-01 NOTE — Discharge Instructions (Addendum)
You were seen in the ER today for shortness of breath. You are negative for COVID and your chest xray is negative for signs of pneumonia or other concerning findings. I would advise managing your symptoms at home with inhalers and following up with your primary care provider for further evaluation. If symptoms worsen, return to the ER.

## 2023-03-01 NOTE — ED Provider Notes (Signed)
[place] EMERGENCY DEPARTMENT AT [facility] Provider Note   CSN: [id number] Arrival date & time: 03/01/23  1300     History Chief Complaint  Patient presents with   Shortness of Breath    Carlos Stevens is a 22 y.o. male.  Patient with a history of asthma presents to the emergency department concerns of shortness of breath.  Reports has been ongoing for about a week or so.  Was seen about a week ago without improvement in symptoms.  Patient previously tested for COVID-19 which is negative.  Patient endorses some sputum production but denies any hemoptysis, fevers, abdominal pain, confusion.  No recent surgery, prolonged travel, not currently anticoagulated for prior clots.   Shortness of Breath      Home Medications Prior to Admission medications   Medication Sig Start Date End Date Taking? Authorizing Provider  albuterol (PROVENTIL HFA;VENTOLIN HFA) 108 (90 Base) MCG/ACT inhaler Inhale 4 puffs into the lungs every 4 (four) hours as needed for wheezing or shortness of breath. 08/22/15   Viviano Simas, NP  albuterol (PROVENTIL) (2.5 MG/3ML) 0.083% nebulizer solution Inhale 3 mLs into the lungs every 4 (four) hours as needed for wheezing or shortness of breath. 08/22/15   Viviano Simas, NP  albuterol (PROVENTIL) (2.5 MG/3ML) 0.083% nebulizer solution Take 3 mLs (2.5 mg total) by nebulization every 6 (six) hours as needed for wheezing or shortness of breath. 03/01/23   Smitty Knudsen, PA-C  amoxicillin (AMOXIL) 500 MG capsule Take 1 capsule (500 mg total) by mouth 3 (three) times daily. 09/03/21   Jacalyn Lefevre, MD  HYDROcodone-acetaminophen (NORCO/VICODIN) 5-325 MG tablet Take 1 tablet by mouth every 4 (four) hours as needed. 09/03/21   Jacalyn Lefevre, MD  ibuprofen (ADVIL) 600 MG tablet Take 1 tablet (600 mg total) by mouth every 6 (six) hours as needed. 09/03/21   Jacalyn Lefevre, MD  predniSONE (DELTASONE) 20 MG tablet Take 2 tablets (40 mg total) by mouth daily with  breakfast. For the next four days 02/24/23   Gerhard Munch, MD      Allergies    Patient has no known allergies.    Review of Systems   Review of Systems  Respiratory:  Positive for shortness of breath.   All other systems reviewed and are negative.   Physical Exam Updated Vital Signs BP (!) 149/108   Pulse 86   Temp 98.1 F (36.7 C)   Resp 18   SpO2 100%  Physical Exam Vitals and nursing note reviewed.  Constitutional:      General: He is not in acute distress.    Appearance: He is well-developed.  HENT:     Head: Normocephalic and atraumatic.  Eyes:     Conjunctiva/sclera: Conjunctivae normal.  Cardiovascular:     Rate and Rhythm: Normal rate and regular rhythm.     Heart sounds: No murmur heard. Pulmonary:     Effort: Pulmonary effort is normal. No respiratory distress.     Breath sounds: Normal breath sounds. No decreased breath sounds, wheezing, rhonchi or rales.  Abdominal:     Palpations: Abdomen is soft.     Tenderness: There is no abdominal tenderness.  Musculoskeletal:        General: No swelling.     Cervical back: Neck supple.  Skin:    General: Skin is warm and dry.     Capillary Refill: Capillary refill takes less than 2 seconds.  Neurological:     Mental Status: He is alert.  Psychiatric:  Mood and Affect: Mood normal.     ED Results / Procedures / Treatments   Labs (all labs ordered are listed, but only abnormal results are displayed) Labs Reviewed  SARS CORONAVIRUS 2 BY RT PCR    EKG None  Radiology DG Chest 2 View  Result Date: 03/01/2023 CLINICAL DATA:  Shortness of breath for 1 week EXAM: CHEST - 2 VIEW COMPARISON:  Chest radiograph 02/24/2023 FINDINGS: The cardiomediastinal silhouette is normal There is no focal consolidation or pulmonary edema. There is no pleural effusion or pneumothorax There is no acute osseous abnormality. IMPRESSION: No radiographic evidence of acute cardiopulmonary process. Electronically Signed   By:  Lesia Hausen M.D.   On: 03/01/2023 14:07    Procedures Procedures   Medications Ordered in ED Medications  albuterol (PROVENTIL) (2.5 MG/3ML) 0.083% nebulizer solution 2.5 mg (2.5 mg Nebulization Given 03/01/23 1653)    ED Course/ Medical Decision Making/ A&P                               Medical Decision Making Amount and/or Complexity of Data Reviewed Radiology: ordered.   This patient presents to the ED for concern of shortness of breath. Differential diagnosis includes PE, viral URI, asthma exacerbation, pneumonia   Lab Tests:  I Ordered, and personally interpreted labs.  The pertinent results include:  COVID-19 negative   Imaging Studies ordered:  I ordered imaging studies including chest xray  I independently visualized and interpreted imaging which showed no acute cardiopulmonary process I agree with the radiologist interpretation   Medicines ordered and prescription drug management:  I ordered medication including albuterol for asthma  Reevaluation of the patient after these medicines showed that the patient improved I have reviewed the patients home medicines and have made adjustments as needed   Problem List / ED Course:  Patient presents to the emergency department with concerns shortness of breath.  Patient has history of asthma. States that he has tried managing symptoms with albuterol at home. Does not currently have a PCP to manage asthma at this time. Patient is once again COVID-19 negative and chest xray is negative for any acute cardiopulmonary process. EKG unremarkable. This appears to be a continuation of prior visit and slightly improved with steroids at home. Will give albuterol treatment here and reassess, although no acute evidence of respiratory distress and no wheezing noted. Patient is PERC negative. Low concern for PE at this time, more likely this is asthma exacerbation given that he is not well controlled with a home regimen. Advised patient  that he should establish care with PCP for repeat evaluation. Patient is agreeable with treatment plan and verbalized understanding strict return precautions. All questions answered prior to patient discharge.  Final Clinical Impression(s) / ED Diagnoses Final diagnoses:  Shortness of breath  Mild persistent asthma with acute exacerbation    Rx / DC Orders ED Discharge Orders          Ordered    For home use only DME Nebulizer machine        03/01/23 1730    albuterol (PROVENTIL) (2.5 MG/3ML) 0.083% nebulizer solution  Every 6 hours PRN,   Status:  Discontinued        03/01/23 1730    albuterol (PROVENTIL) (2.5 MG/3ML) 0.083% nebulizer solution  Every 6 hours PRN        03/01/23 1738  Smitty Knudsen, PA-C 03/01/23 1749    Elayne Snare K, DO 03/01/23 2145

## 2023-04-29 IMAGING — DX DG CHEST 2V
2 series · 2 of 2 positions shown · non-contrast
Comparison: August 22, 2015

CLINICAL DATA: Sore throat and fever for 4 days.

EXAM:
CHEST - 2 VIEW

[w chest pa]
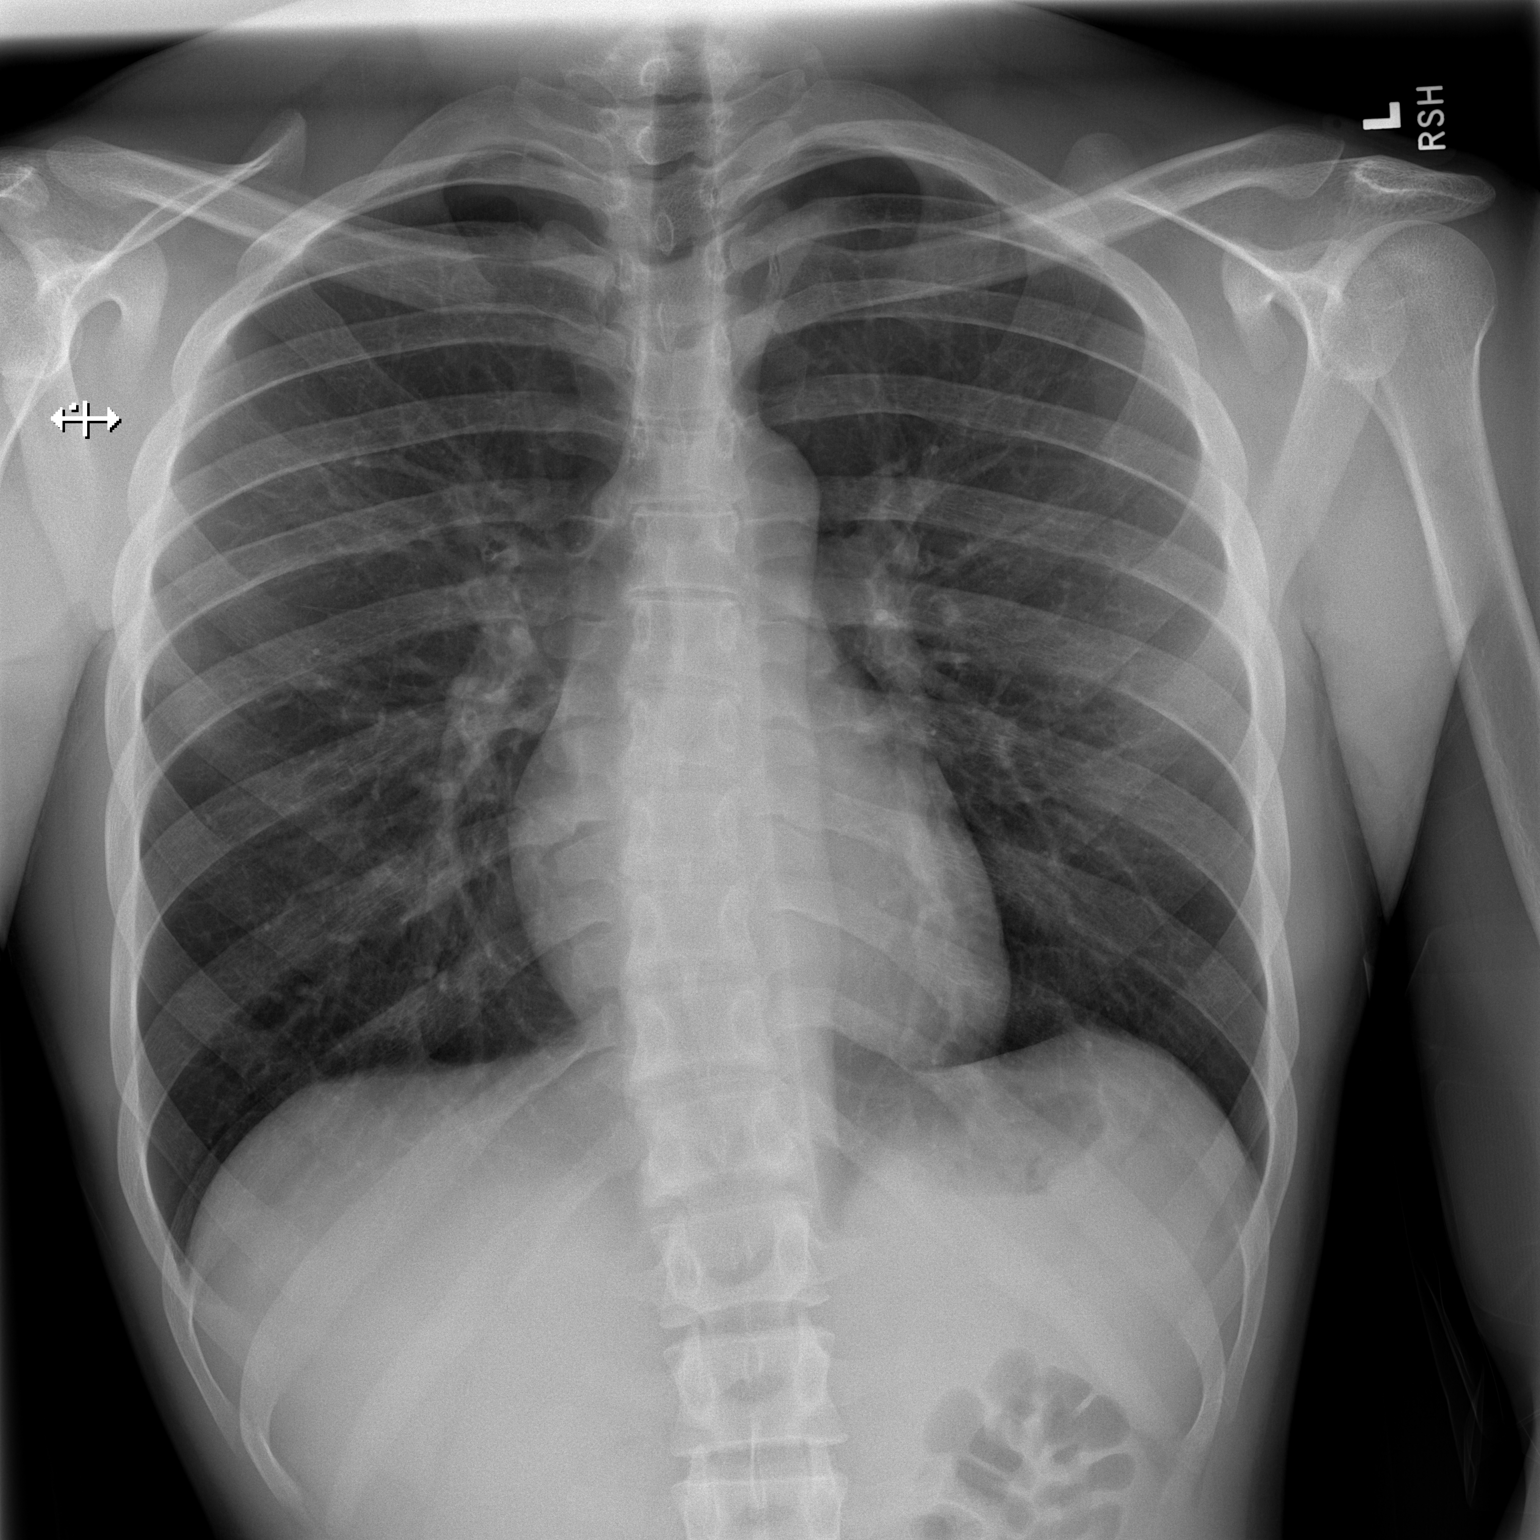

[w chest lat]
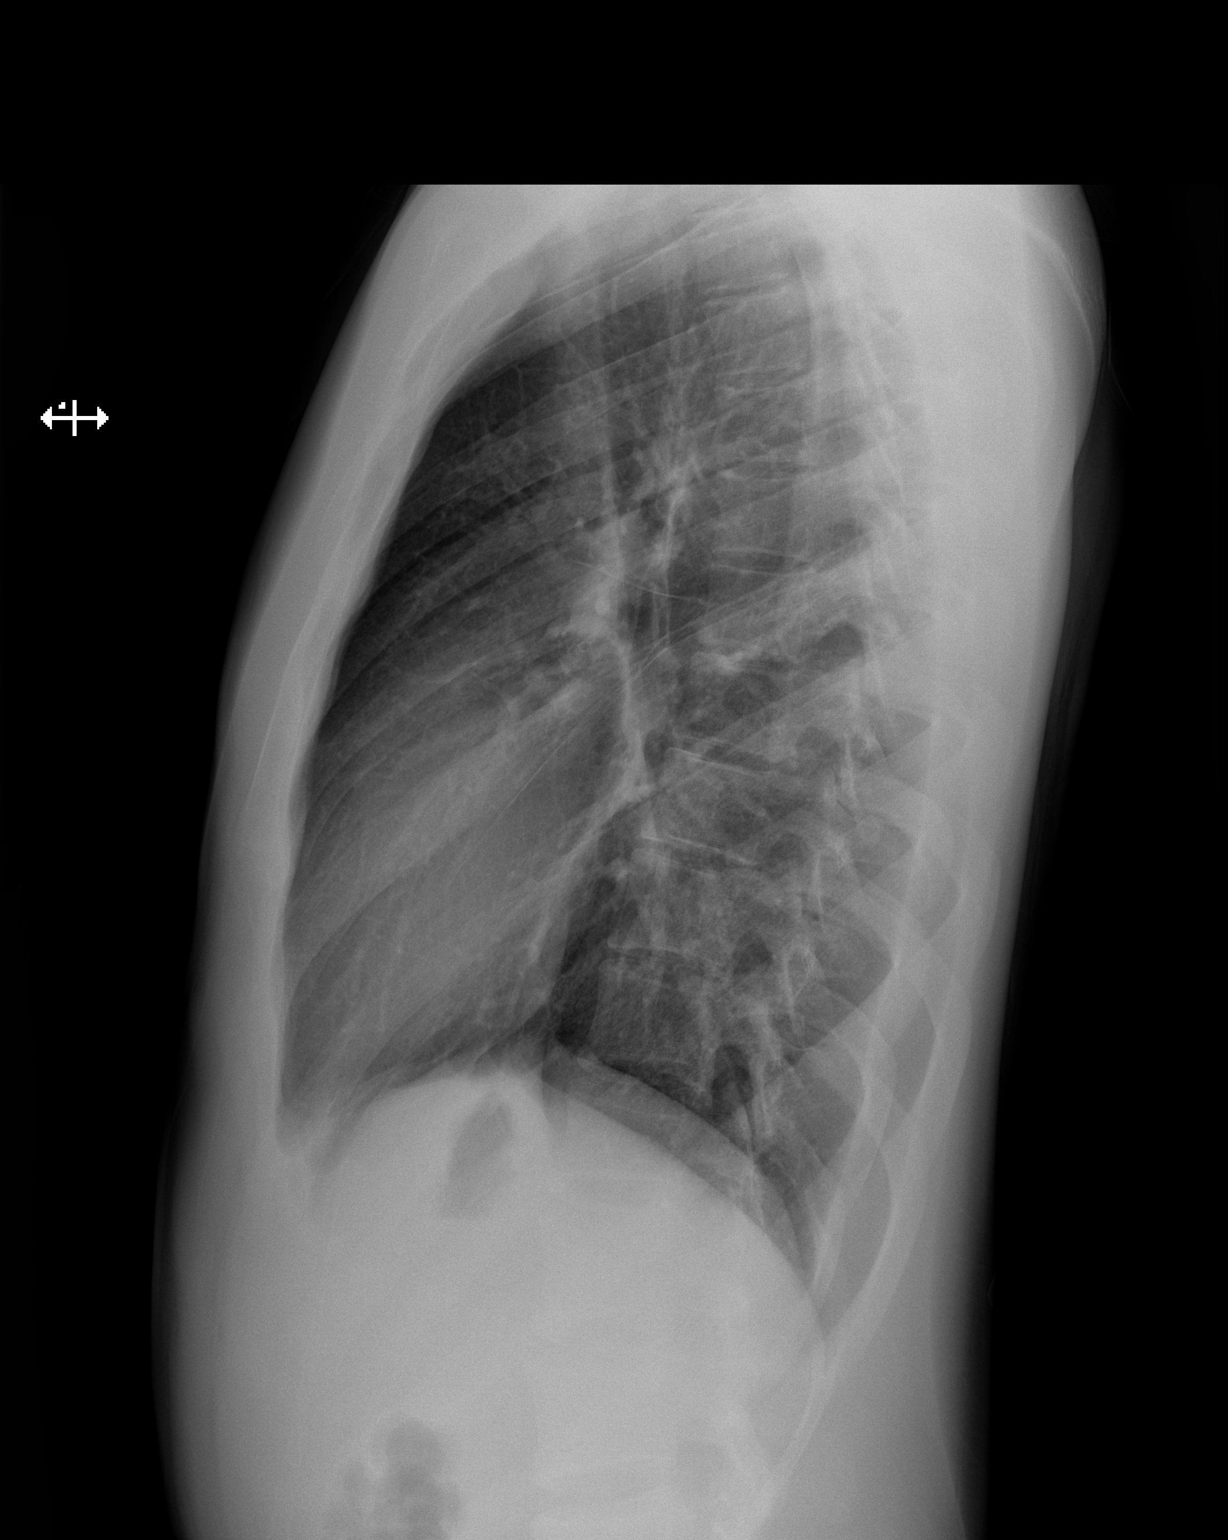

[2 of 2 positions shown; findings below may reference images not displayed]

FINDINGS: The heart size and mediastinal contours are within normal limits.
Both lungs are clear. The visualized skeletal structures are
unremarkable.
IMPRESSION: No active cardiopulmonary disease.

## 2023-06-28 ENCOUNTER — Emergency Department (HOSPITAL_COMMUNITY)
Admission: EM | Admit: 2023-06-28 | Discharge: 2023-06-29 | Disposition: A | Discharge status: Home / Self Care | Payer: Medicaid Other | Attending: Emergency Medicine | Admitting: Emergency Medicine

## 2023-06-28 ENCOUNTER — Other Ambulatory Visit: Payer: Self-pay

## 2023-06-28 ENCOUNTER — Emergency Department (HOSPITAL_COMMUNITY): Payer: Medicaid Other

## 2023-06-28 ENCOUNTER — Encounter (HOSPITAL_COMMUNITY): Payer: Self-pay | Admitting: Emergency Medicine

## 2023-06-28 DIAGNOSIS — Y92410 Unspecified street and highway as the place of occurrence of the external cause: Secondary | ICD-10-CM | POA: Diagnosis not present

## 2023-06-28 DIAGNOSIS — J45909 Unspecified asthma, uncomplicated: Secondary | ICD-10-CM | POA: Insufficient documentation

## 2023-06-28 DIAGNOSIS — R519 Headache, unspecified: Secondary | ICD-10-CM | POA: Insufficient documentation

## 2023-06-28 DIAGNOSIS — R072 Precordial pain: Secondary | ICD-10-CM | POA: Diagnosis not present

## 2023-06-28 DIAGNOSIS — S01112A Laceration without foreign body of left eyelid and periocular area, initial encounter: Secondary | ICD-10-CM | POA: Diagnosis present

## 2023-06-28 MED ORDER — ACETAMINOPHEN 325 MG PO TABS: 975.0000 mg | ORAL_TABLET | Freq: Once | ORAL | Status: DC
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
## 2023-09-02 ENCOUNTER — Other Ambulatory Visit: Payer: Self-pay

## 2023-09-02 ENCOUNTER — Emergency Department (HOSPITAL_COMMUNITY): Payer: Medicaid Other

## 2023-09-02 ENCOUNTER — Emergency Department (HOSPITAL_COMMUNITY)
Admission: EM | Admit: 2023-09-02 | Discharge: 2023-09-02 | Disposition: A | Discharge status: Home / Self Care | Payer: Medicaid Other | Attending: Student | Admitting: Student

## 2023-09-02 ENCOUNTER — Encounter (HOSPITAL_COMMUNITY): Payer: Self-pay | Admitting: *Deleted

## 2023-09-02 DIAGNOSIS — R051 Acute cough: Secondary | ICD-10-CM

## 2023-09-02 DIAGNOSIS — J189 Pneumonia, unspecified organism: Secondary | ICD-10-CM | POA: Diagnosis not present

## 2023-09-02 DIAGNOSIS — R059 Cough, unspecified: Secondary | ICD-10-CM | POA: Diagnosis present

## 2023-09-02 DIAGNOSIS — J101 Influenza due to other identified influenza virus with other respiratory manifestations: Secondary | ICD-10-CM | POA: Diagnosis not present

## 2023-09-02 DIAGNOSIS — J45909 Unspecified asthma, uncomplicated: Secondary | ICD-10-CM | POA: Diagnosis not present

## 2023-09-02 LAB — RESP PANEL BY RT-PCR (RSV, FLU A&B, COVID)  RVPGX2
Influenza A by PCR: POSITIVE — AB
Influenza B by PCR: NEGATIVE
Resp Syncytial Virus by PCR: NEGATIVE
SARS Coronavirus 2 by RT PCR: NEGATIVE

## 2023-09-02 MED ORDER — ACETAMINOPHEN 500 MG PO TABS
1000.0000 mg | ORAL_TABLET | Freq: Once | ORAL | Status: AC
  Administered 2023-09-02: 1000 mg via ORAL
  Filled 2023-09-02: qty 2

## 2023-09-02 MED ORDER — CEFTRIAXONE SODIUM 1 G IJ SOLR
1.0000 g | Freq: Once | INTRAMUSCULAR | Status: AC
  Administered 2023-09-02: 1 g via INTRAMUSCULAR
  Filled 2023-09-02: qty 10

## 2023-09-02 MED ORDER — LIDOCAINE HCL (PF) 1 % IJ SOLN
INTRAMUSCULAR | Status: AC
  Filled 2023-09-02: qty 5

## 2023-09-02 MED ORDER — AZITHROMYCIN 250 MG PO TABS: 250.0000 mg | ORAL_TABLET | Freq: Every day | ORAL | 0 refills | Status: AC

## 2023-09-02 MED ORDER — OSELTAMIVIR PHOSPHATE 75 MG PO CAPS: 75.0000 mg | ORAL_CAPSULE | Freq: Two times a day (BID) | ORAL | 0 refills | Status: AC

## 2023-09-02 MED ORDER — ALBUTEROL SULFATE HFA 108 (90 BASE) MCG/ACT IN AERS
2.0000 | INHALATION_SPRAY | RESPIRATORY_TRACT | Status: DC | PRN
  Administered 2023-09-02: 2 via RESPIRATORY_TRACT
  Filled 2023-09-02: qty 6.7

## 2023-09-02 MED ORDER — IBUPROFEN 800 MG PO TABS
800.0000 mg | ORAL_TABLET | Freq: Once | ORAL | Status: AC
  Administered 2023-09-02: 800 mg via ORAL
  Filled 2023-09-02: qty 1

## 2023-09-02 MED ORDER — BENZONATATE 100 MG PO CAPS: 100.0000 mg | ORAL_CAPSULE | Freq: Two times a day (BID) | ORAL | 0 refills | Status: AC | PRN

## 2023-09-02 MED ORDER — OSELTAMIVIR PHOSPHATE 75 MG PO CAPS
75.0000 mg | ORAL_CAPSULE | Freq: Once | ORAL | Status: AC
  Administered 2023-09-02: 75 mg via ORAL
  Filled 2023-09-02: qty 1

## 2023-09-02 MED ORDER — ONDANSETRON 4 MG PO TBDP
8.0000 mg | ORAL_TABLET | Freq: Once | ORAL | Status: AC
  Administered 2023-09-02: 8 mg via ORAL
  Filled 2023-09-02: qty 2

## 2023-09-02 MED ORDER — AZITHROMYCIN 250 MG PO TABS: 500.0000 mg | ORAL_TABLET | Freq: Every day | ORAL | Status: DC

## 2023-09-02 NOTE — ED Notes (Signed)
 Pt ambulatory to hallways 13. No resp distress

## 2023-09-02 NOTE — ED Triage Notes (Signed)
 Pt is here for cough, congestion, body aches and fever x5 days.

## 2023-09-02 NOTE — ED Provider Notes (Signed)
 [facility] Emergency Department Provider Note MRN:  [id number]  Arrival date & time: 09/02/23     Chief Complaint   Cough   History of Present Illness   Carlos Stevens is a 23 y.o. year-old male presents to the ED with chief complaint of cough.  Onset of symptoms was about 5 days ago.  He states that he acutely worsened today.  He has history of asthma, but denies any other medical problems.  He states he uses an inhaler as needed.  States that he has had associated body aches.  Has tried over-the-counter medications with some improvement..  History provided by patient.   Review of Systems  Pertinent positive and negative review of systems noted in HPI.    Physical Exam   Vitals:   09/02/23 1831 09/02/23 2121  BP: (!) 132/106 (!) 133/97  Pulse: 90 87  Resp: (!) 22 (!) 22  Temp: 100.1 F (37.8 C) 99.7 F (37.6 C)  SpO2: 90% 98%    CONSTITUTIONAL:  non toxic-appearing, NAD NEURO:  Alert and oriented x 3, CN 3-12 grossly intact EYES:  eyes equal and reactive ENT/NECK:  Supple, no stridor  CARDIO:  normal rate, regular rhythm, appears well-perfused  PULM:  No respiratory distress, CTAB GI/GU:  non-distended,  MSK/SPINE:  No gross deformities, no edema, moves all extremities  SKIN:  no rash, atraumatic   *Additional and/or pertinent findings included in MDM below  Diagnostic and Interventional Summary    EKG Interpretation Date/Time:    Ventricular Rate:    PR Interval:    QRS Duration:    QT Interval:    QTC Calculation:   R Axis:      Text Interpretation:         Labs Reviewed  RESP PANEL BY RT-PCR (RSV, FLU A&B, COVID)  [id number] - Abnormal; Notable for the following components:      Result Value   Influenza A by PCR POSITIVE (*)    All other components within normal limits    DG Chest 2 View  Final Result      Medications  albuterol (VENTOLIN HFA) 108 (90 Base) MCG/ACT inhaler 2 puff (2 puffs Inhalation Given 09/02/23 1456)   azithromycin (ZITHROMAX) tablet 500 mg (has no administration in time range)  ibuprofen (ADVIL) tablet 800 mg (800 mg Oral Given 09/02/23 1456)  ondansetron (ZOFRAN-ODT) disintegrating tablet 8 mg (8 mg Oral Given 09/02/23 1533)  acetaminophen (TYLENOL) tablet 1,000 mg (1,000 mg Oral Given 09/02/23 1533)  cefTRIAXone (ROCEPHIN) injection 1 g (1 g Intramuscular Given 09/02/23 2337)  oseltamivir (TAMIFLU) capsule 75 mg (75 mg Oral Given 09/02/23 2337)  lidocaine (PF) (XYLOCAINE) 1 % injection (  Given 09/02/23 2346)     Procedures  /  Critical Care Procedures  ED Course and Medical Decision Making  I have reviewed the triage vital signs, the nursing notes, and pertinent available records from the EMR.  Social Determinants Affecting Complexity of Care: Patient has no clinically significant social determinants affecting this chief complaint..   ED Course: Clinical Course as of 09/02/23 2354  Thu Sep 02, 2023  2352 DG Chest 2 View Left lower lobe infiltrate, likely secondary to flu, but will cover with antibiotics to be safe. [RB]  2352 Resp panel by RT-PCR (RSV, Flu A&B, Covid) Anterior Nasal Swab(!) And flu a positive, will treat with Tamiflu [RB]    Clinical Course User Index [RB] Roxy Horseman, PA-C    Medical Decision Making Patient here with cough.  He has been sick for about 5 days, but worsened today.  Workup in triage is notable for a left lower lobe pneumonia.  He also test positive for influenza A.  Unclear when flu symptoms started since he acutely worsened today.  He is still mildly febrile.  I will cover him with Tamiflu.  I will treat with Zithromax and Rocephin here and discharge on Z-Pak.  He is not wheezing.  No respiratory distress.  He is agreeable with discharge home.  Amount and/or Complexity of Data Reviewed Labs:  Decision-making details documented in ED Course. Radiology: ordered and independent interpretation performed. Decision-making details documented in ED  Course.  Risk Prescription drug management.         Consultants: No consultations were needed in caring for this patient.   Treatment and Plan: I considered admission due to patient's initial presentation, but after considering the examination and diagnostic results, patient will not require admission and can be discharged with outpatient follow-up.    Final Clinical Impressions(s) / ED Diagnoses     ICD-10-CM   1. Acute cough  R05.1     2. Influenza A  J10.1     3. Pneumonia due to infectious organism, unspecified laterality, unspecified part of lung  J18.9       ED Discharge Orders          Ordered    azithromycin (ZITHROMAX) 250 MG tablet  Daily        09/02/23 2348    oseltamivir (TAMIFLU) 75 MG capsule  Every 12 hours        09/02/23 2348    benzonatate (TESSALON) 100 MG capsule  2 times daily PRN        09/02/23 2348              Discharge Instructions Discussed with and Provided to Patient:   Discharge Instructions   None      Roxy Horseman, PA-C 09/02/23 2354    Zadie Rhine, MD 09/03/23 [phone]

## 2023-09-02 NOTE — ED Provider Triage Note (Signed)
 Emergency Medicine Provider Triage Evaluation Note  Carlos Stevens , a 23 y.o. male  was evaluated in triage.  Pt complains of cough, congestion, body aches.  Symptoms present for the past 4 to 5 days.  Denies any known sick contact.  Has been trying over-the-counter medications with some improvement of symptoms.  Presents emergency department for reassessment.  States that he had 1 episode of vomiting when he was in the restroom earlier today. Review of Systems  Positive: See above Negative:   Physical Exam  BP (!) 136/95 (BP Location: Right Arm)   Pulse (!) 126   Temp 100.2 F (37.9 C)   Resp 20   SpO2 95%  Gen:   Awake, no distress   Resp:  Normal effort  MSK:   Moves extremities without difficulty  Other:    Medical Decision Making  Medically screening exam initiated at 3:16 PM.  Appropriate orders placed.  Carlos Stevens was informed that the remainder of the evaluation will be completed by another provider, this initial triage assessment does not replace that evaluation, and the importance of remaining in the ED until their evaluation is complete.     Peter Garter, [place] 09/02/23 [phone]

## 2023-09-02 NOTE — ED Notes (Signed)
 Pt reports that he is thirsty, 3 cups of water given, po intake encouraged
